# Patient Record
Sex: Female | Born: 1990 | Hispanic: Yes | Marital: Married | Smoking: Never smoker
Health system: Southern US, Academic
[De-identification: ages and names within clinical notes are randomized; demographics above are authoritative.]

## PROBLEM LIST (undated history)

## (undated) DIAGNOSIS — M419 Scoliosis, unspecified: Secondary | ICD-10-CM

## (undated) DIAGNOSIS — F419 Anxiety disorder, unspecified: Secondary | ICD-10-CM

## (undated) HISTORY — DX: Scoliosis, unspecified: M41.9

## (undated) HISTORY — DX: Anxiety disorder, unspecified: F41.9

---

## 2020-03-13 ENCOUNTER — Ambulatory Visit (INDEPENDENT_AMBULATORY_CARE_PROVIDER_SITE_OTHER): Payer: Self-pay

## 2020-03-13 NOTE — Telephone Encounter (Signed)
I called and spoke with Margaret Mccormick she is aware that she needs to hand carry her disk with her to her appt, on Monday. [email or web address] 03-13-20

## 2020-03-17 ENCOUNTER — Encounter (INDEPENDENT_AMBULATORY_CARE_PROVIDER_SITE_OTHER): Payer: Self-pay | Admitting: Neurological Surgery

## 2020-03-17 ENCOUNTER — Other Ambulatory Visit: Payer: Self-pay

## 2020-03-17 ENCOUNTER — Ambulatory Visit: Payer: Medicaid Other | Attending: Neurological Surgery | Admitting: Neurological Surgery

## 2020-03-17 VITALS — BP 122/74 | HR 60 | Temp 97.2°F | Ht 64.0 in | Wt 221.3 lb

## 2020-03-17 DIAGNOSIS — G935 Compression of brain: Secondary | ICD-10-CM | POA: Insufficient documentation

## 2020-03-17 DIAGNOSIS — Z6837 Body mass index (BMI) 37.0-37.9, adult: Secondary | ICD-10-CM

## 2020-03-17 NOTE — Progress Notes (Signed)
I personally saw and examined the patient. See the mid-level provider's note for additional details. My findings are as follows:    History:  2 year history of pressure sensation in back of head. More noticeable past year. Gets some dizziness and nausea as well. Recently started on propranolol.    Exam:  Awake alert motor intact.    Studies:  MRI shows chiari I with tonsils 8 mm below FM. No hydrocephalus.    Imp:  Chiari I. Has some posterior head discomfort, starting medical therapy.    Plan:  Symptoms mild and limited at this point. Agree with initial medical treatment. Return if symptoms worsen, additional neurological findings develop.      Barnabas Lister, MD 03/17/2020, 10:34

## 2020-03-17 NOTE — Progress Notes (Signed)
[facility]  New Outpatient/Consult    Margaret Mccormick  Date of Service: 03/20/2020  Referring physician: Dorinda Hill, MD  [address]  [address] 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 right handed female who presents for evaluation of Chiari Malformation.     She began having headaches about 2 years ago. Headaches are occipital and are associated with dizziness, nausea, and difficulty focusing. She has started taking Inderal for headaches and believes they have improves slightly. Headaches still occur daily and are aggravated by having a bowel movement. Headaches occur at rest as well, slightly relieved with laying down. Dizziness only occurs with movement. She endorses frequent neck and shoulder tension- denies pain today. Has occasional arm pain with intermittent tingling of BUE. She endorses generalized fatigue and feels that she "spaces out". Reports a strange sensation around her occiput that is sometimes described as sore. She denies obvious seizure like activity, leg pain, falls, gait instability, use of assistive devices, and bowel.bladder changes.     Conservative treatments: No frequent PRN medications- hasn't had much improvement. No PT or injections.     PMH significant for scoliosis and anxiety  No tobacco use  No spine surgeries    Past History    Current Outpatient Medications:     propranoloL (INDERAL) 20 mg Oral Tablet, Take 20 mg by mouth Three times a day, Disp: , Rfl:   No Known Allergies  Past Medical History:   Diagnosis Date    Anxiety     Scoliosis          Past Surgical History:   Procedure Laterality Date    HX CHOLECYSTECTOMY         Family History  Family Medical History:    None            Social History  Social History     Socioeconomic History    Marital status: Married     Spouse name: Not on file    Number of children: Not on file    Years of education: Not on file    Highest education level: Not on file   Tobacco Use    Smoking status: Never Smoker    Smokeless tobacco: Never Used   Substance and Sexual Activity    Alcohol use: Yes     Comment: occ    Drug use: Never       Review of Systems  Other than ROS in the HPI, all other systems were negative.  Examination  BP 122/74    Pulse 60    Temp 36.2 C (97.2 F) (Thermal Scan)    Ht 1.626 m (5\' 4" )    Wt 100 kg (221 lb 5.5 oz)    SpO2 96%    BMI 37.99 kg/m       Constitutional:Well groomed, in no apparent distress  Skin:  Warm, dry, good color. No wounds or incisions  Head:  Normocephalic/ Atraumatic.   Eyes: Conjunctiva clear, + red reflex  ENT:  Tongue midline, trachea midline   Cardiovascular:    Peripheral vascular system: Pulses palpable, no  peripheral edema   Respiratory:  Unlabored  Gastrointestinal: Soft, no tenderness  Hem/Lymph:  Deferred  Musculoskeletal  Gait and Station: slow and steady with no ataxia  Strength:  Arm Right Left Leg Right Left   Deltoid (shoulder abduction) 5/5 5/5 Hip Flexion 5/5 5/5   Biceps (Elbow flexion) 5/5 5/5 Knee Flexion 5/5 5/5   Triceps (Elbow extension) 5/5 5/5 Knee Extension 5/5 5/5   Grip 5/5 5/5 Foot Dorsiflexion 5/5 5/5      Foot Plantarflexion 5/5 5/5   Muscle tone (upper extremities): WNL  Muscle tone (lower extremities): WNL  Sensory: Sensory exam in the upper and lower extremities is normal  DTR's:    Brachio-  radialis Biceps Triceps Patellar Achilles Babinski Hoffman's   Right 2+ 2+ 2+ 2+ 2+ Downgoing Not present   Left 2+ 2+ 2+ 2+ 2+ Downgoing Not present   Ankle clonus: Negative  Coordination: Normal rapid alternating movements and finger to nose intact. No pronator drift.   Musculoskeletal tenderness: Positive in cervical spine   Neurological  Level of consciousness: Alert and  oriented  Recent and remote memory: Good recall and able to follow commands  Attention span and concentration: Normal in conversation  Language/Speech: No aphasia or dysarthria  Fund of knowledge: Appropriate in this setting  Cranial Nerves  2nd: PERRL  3rd,4th,6th:  EOMI, no nystagmus  5th: Facial sensation intact  7th: No facial asymmetry or droop  8th: Hearing grossly intact  9th/ 10th: Palate symmetric  11th: Normal shoulder shrug  12th: Tongue midline    Data reviewed  - Reviewed MRI Brain wo done 12/05/19 in [facility]  - Official radiology report pending  - See Dr. Nickie Retort interpretation   - Outside records/ Previous charts reviewed      Assessment:  Margaret Mccormick is a 29 y.o. right handed female who presents for evaluation of Chiari Malformation. She endorses occipital pressure and headaches that are associated with nausea and dizziness. She has started taking inderal for headaches. On exam strength and sensation are intact. MRI was reviewed with Dr. Cherrie Distance and no surgical intervention is indicated at this time.     ICD-10-CM    1. 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 Plan was explained and discussed during the visit. The pt was given the opportunity to ask questions and they were addressed. Margaret Mccormick is in agreement with the following plan:    - No neurosurgical intervention is indicated  - Continue medical management   - RTC as needed for persistent or worsening symptoms     - The patient has been advised to follow up with their PCP in regards any chronic medical conditions and any non-neurosurgical symptoms that they may have. A copy of the note from today's clinic appointment will be sent to the patient's PCP Onnie Boer, NP on file (confirmed during visit)    The patient was seen as a shared visit with the co-signing faculty.    Charlette Caffey, APRN,NP-C 03/20/2020, 14:44    With Dr. Cherrie Distance

## 2020-03-20 DIAGNOSIS — G935 Compression of brain: Secondary | ICD-10-CM | POA: Insufficient documentation

## 2022-03-30 IMAGING — MR MRI BRAIN W/O CONTRAST
9 series · 48 of 48 positions shown · non-contrast
Comparison: None available.

﻿EXAM:  MRI BRAIN W/O CONTRAST
INDICATION: Headaches, migraines.  History of Chiari malformation.
TECHNIQUE: Noncontrast multiplanar, multisequence MRI was performed.

[Series 5: DWI · axial · 5.0mm · 1.35mm/px · z∈[-30,+96]mm · 16 of 88 slices shown (1 of 3)]
[im 1/88]
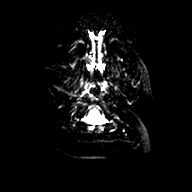
[im 6/88]
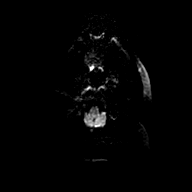
[im 12/88]
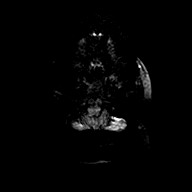
[im 18/88]
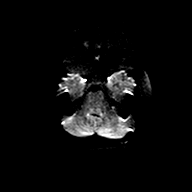
[im 24/88]
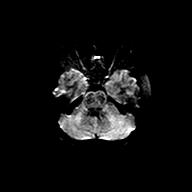
[im 30/88]
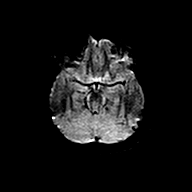
[im 35/88]
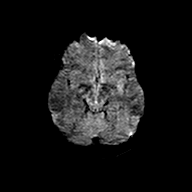
[im 41/88]
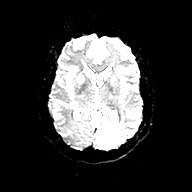
[im 47/88]
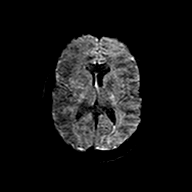
[im 53/88]
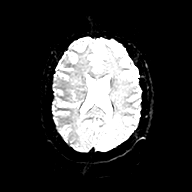
[im 59/88]
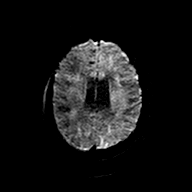
[im 64/88]
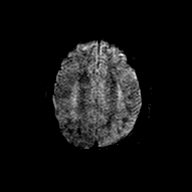
[im 70/88]
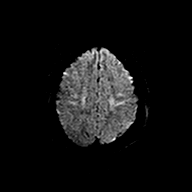
[im 76/88]
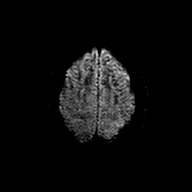
[im 82/88]
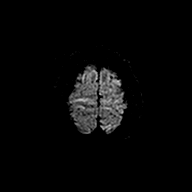
[im 88/88]
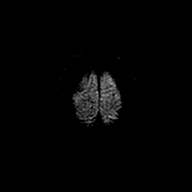

[Series 6: DWI · axial · 5.0mm · 1.35mm/px · z∈[-30,+96]mm · 4 of 22 slices shown (2 of 3)]
[im 1/22]
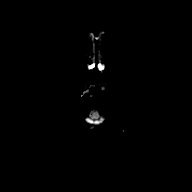
[im 8/22]
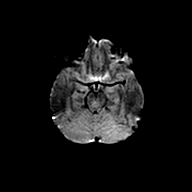
[im 15/22]
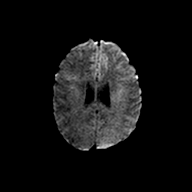
[im 22/22]
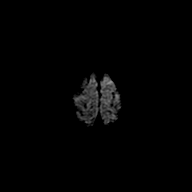

[Series 7: DWI · axial · 5.0mm · 1.35mm/px · z∈[-30,+96]mm · 4 of 22 slices shown (3 of 3)]
[im 1/22]
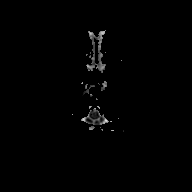
[im 8/22]
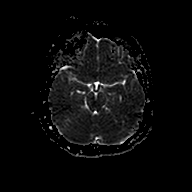
[im 15/22]
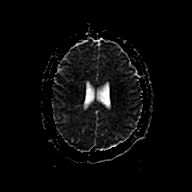
[im 22/22]
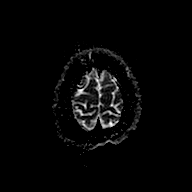

[Series 8: FLAIR · sagittal · 4.0mm · 0.75mm/px · 4 of 26 slices shown (1 of 2)]
[im 1/26]
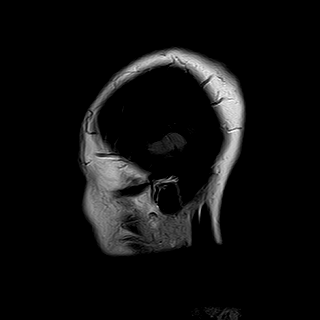
[im 9/26]
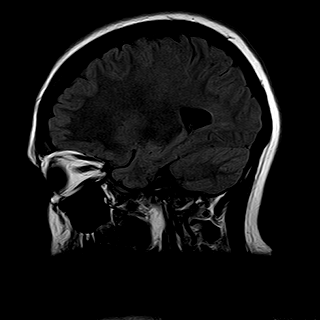
[im 17/26]
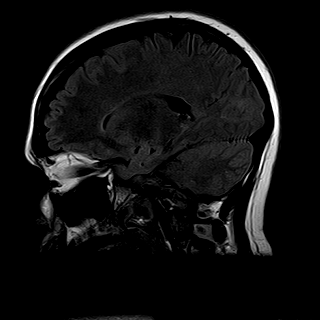
[im 26/26]
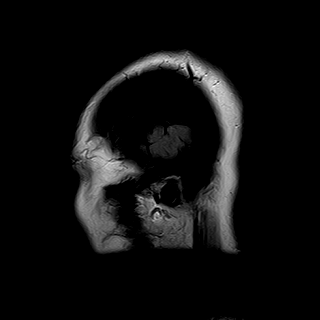

[Series 9: T2 · axial · 5.0mm · 0.43mm/px · z∈[-43,+101]mm · 4 of 25 slices shown (1 of 2)]
[im 1/25]
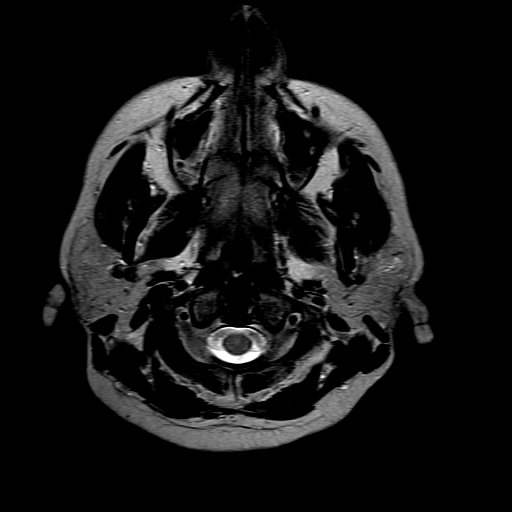
[im 9/25]
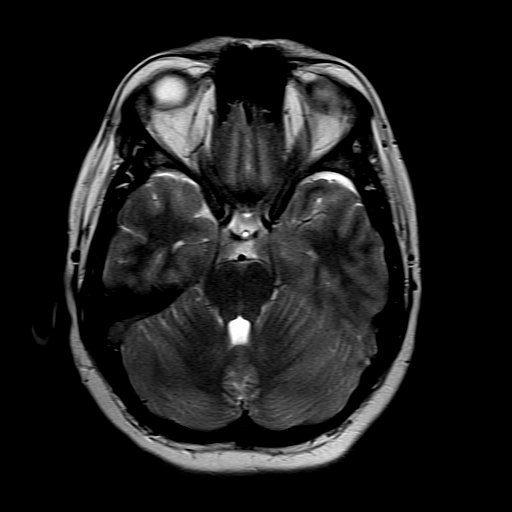
[im 17/25]
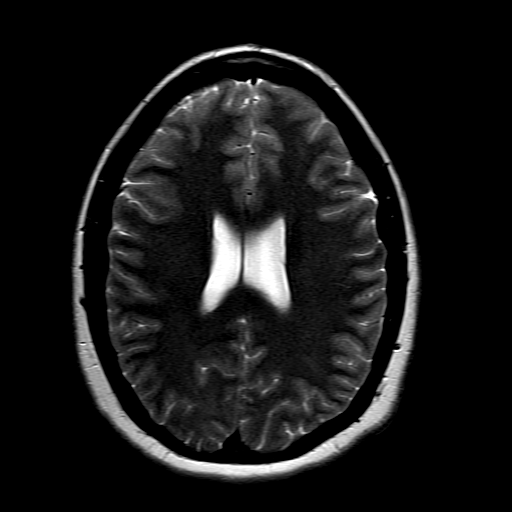
[im 25/25]
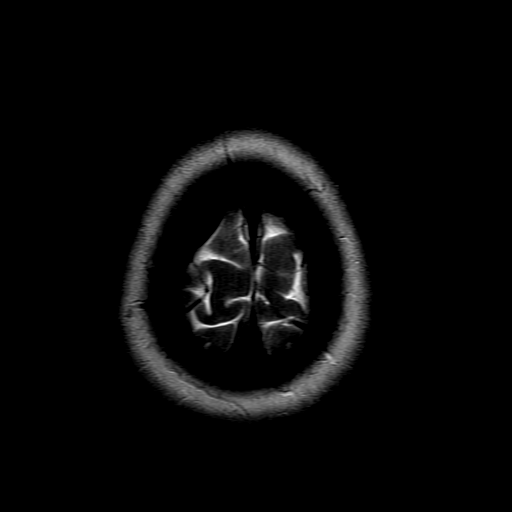

[Series 10: FLAIR · axial · 5.0mm · 0.76mm/px · z∈[-34,+92]mm · 4 of 22 slices shown (2 of 2)]
[im 1/22]
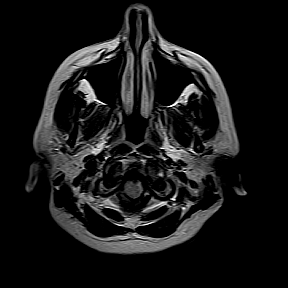
[im 8/22]
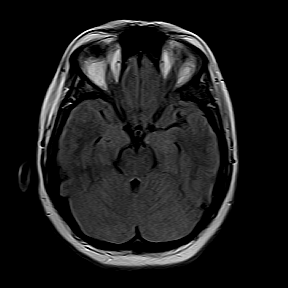
[im 15/22]
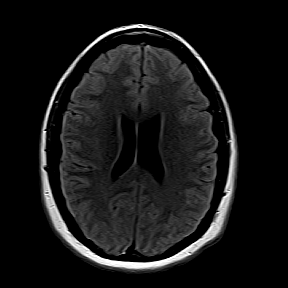
[im 22/22]
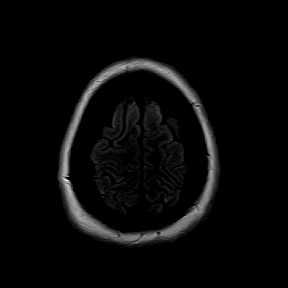

[Series 11: T1 · axial · 5.0mm · 0.69mm/px · z∈[-43,+101]mm · 4 of 25 slices shown]
[im 1/25]
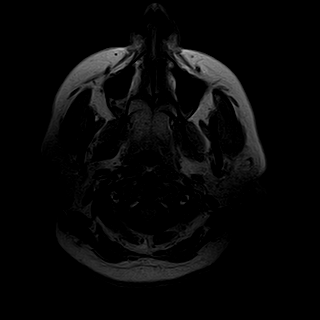
[im 9/25]
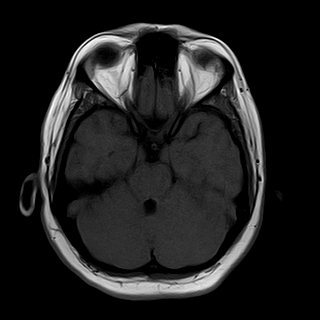
[im 17/25]
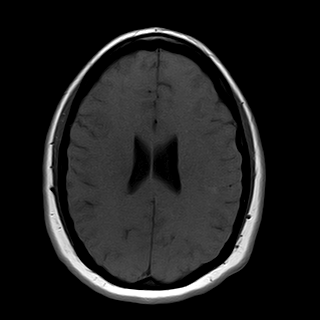
[im 25/25]
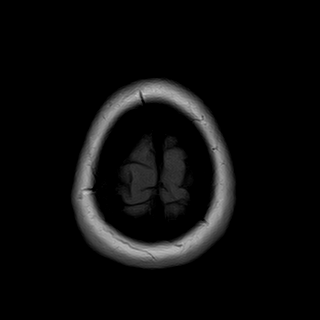

[Series 12: T2-star · axial · 5.0mm · 0.69mm/px · z∈[-43,+101]mm · 4 of 25 slices shown]
[im 1/25]
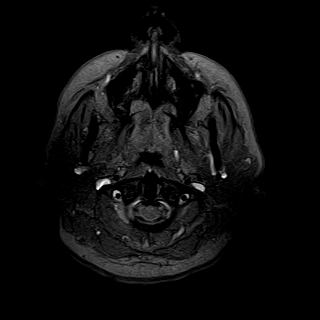
[im 9/25]
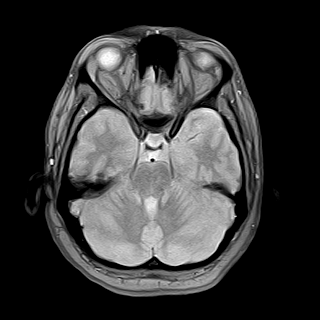
[im 17/25]
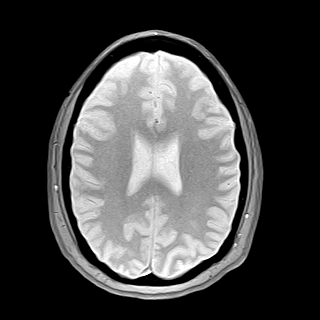
[im 25/25]
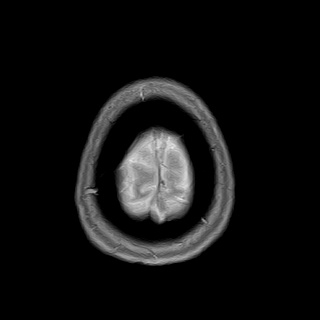

[Series 13: T2 · coronal · 6.0mm · 0.43mm/px · 4 of 24 slices shown (2 of 2)]
[im 1/24]
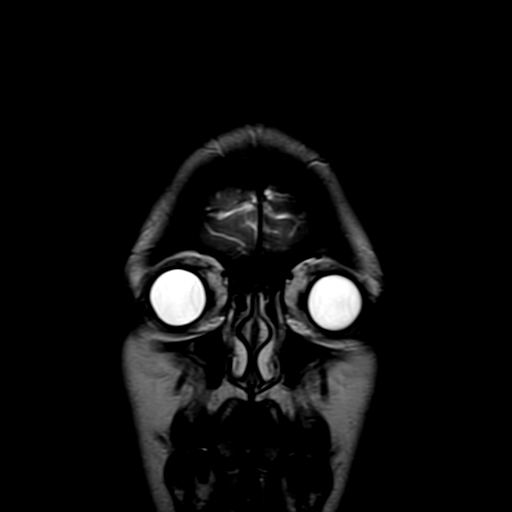
[im 8/24]
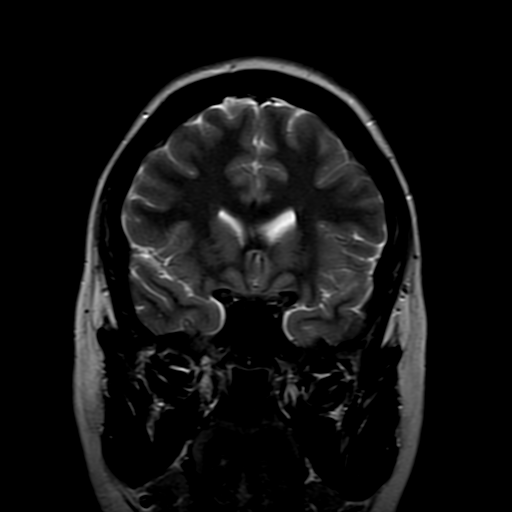
[im 16/24]
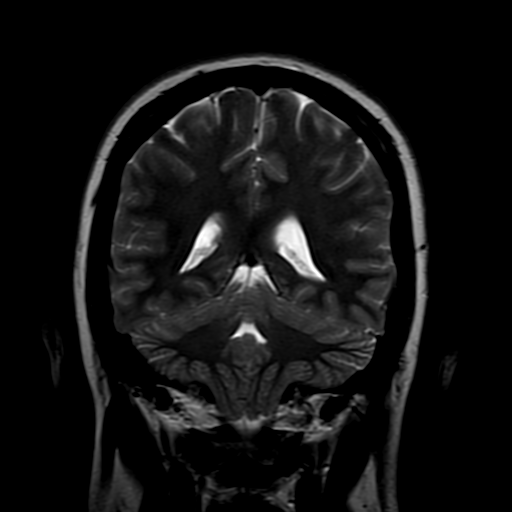
[im 24/24]
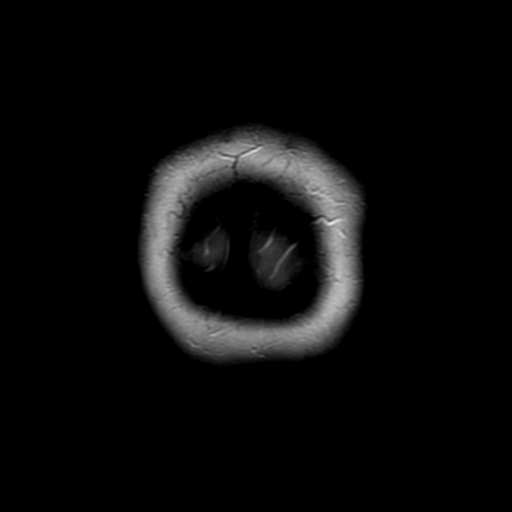

[48 of 48 positions shown; findings below may reference images not displayed]

FINDINGS: There is a Chiari I malformation.  The cerebellar tonsils extend approximately 9 mm below the level of the foramen magnum.  

There is no evidence of mass, hemorrhage, shift of the midline structures, or extra-axial collection.  There is no significant white matter disease.  No aneurysm is seen.  

The paranasal sinuses and mastoid air cells are essentially clear.
IMPRESSION: 1. Chiari I malformation.

2. Otherwise, unremarkable examination.

## 2023-04-05 IMAGING — MR MRI BRAIN W/O CONTRAST
9 series · 48 of 48 positions shown · non-contrast
Comparison: MRI brain dated 03/30/2022.

﻿EXAM:  MRI BRAIN W/O CONTRAST
INDICATION: 32-year-old with persistent headache.  History of Chiari 1 malformation on prior MRI brain. Family history of cerebral aneurysm in grandmother.
TECHNIQUE: Multiplanar, multisequential MRI of the brain was performed without administration of contrast.

[Series 5: DWI · axial · 5.0mm · 1.35mm/px · z∈[-86,+46]mm · 16 of 92 slices shown (1 of 3)]
[im 1/92]
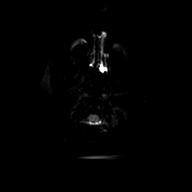
[im 7/92]
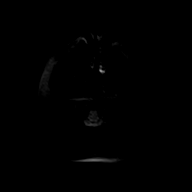
[im 13/92]
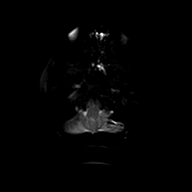
[im 19/92]
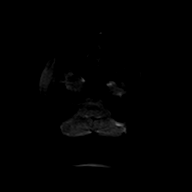
[im 25/92]
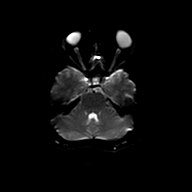
[im 31/92]
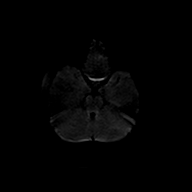
[im 37/92]
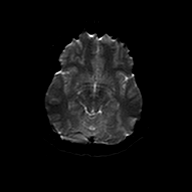
[im 43/92]
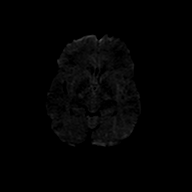
[im 49/92]
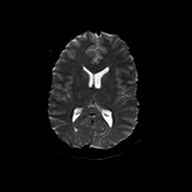
[im 55/92]
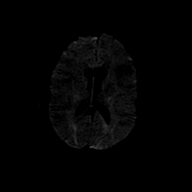
[im 61/92]
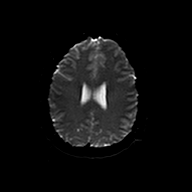
[im 67/92]
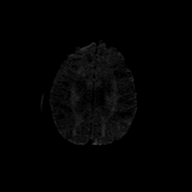
[im 73/92]
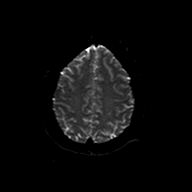
[im 79/92]
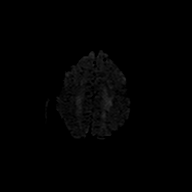
[im 85/92]
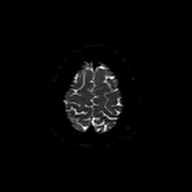
[im 92/92]
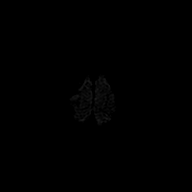

[Series 6: DWI · axial · 5.0mm · 1.35mm/px · z∈[-86,+46]mm · 4 of 23 slices shown (2 of 3)]
[im 1/23]
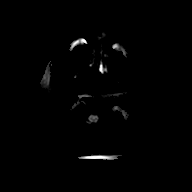
[im 8/23]
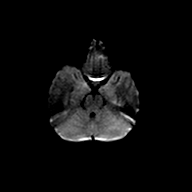
[im 15/23]
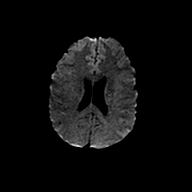
[im 23/23]
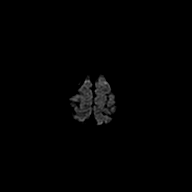

[Series 7: DWI · axial · 5.0mm · 1.35mm/px · z∈[-86,+46]mm · 4 of 23 slices shown (3 of 3)]
[im 1/23]
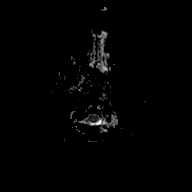
[im 8/23]
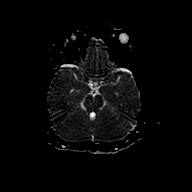
[im 15/23]
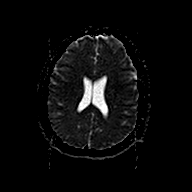
[im 23/23]
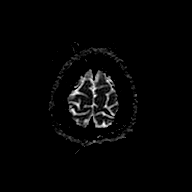

[Series 8: FLAIR · sagittal · 4.0mm · 0.75mm/px · 4 of 26 slices shown (1 of 2)]
[im 1/26]
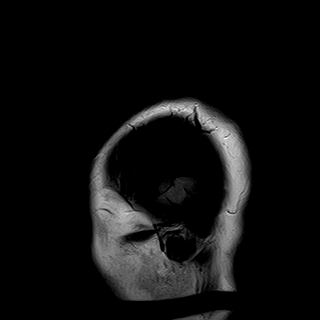
[im 9/26]
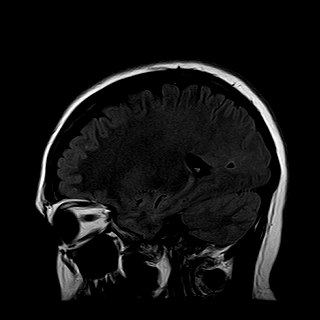
[im 17/26]
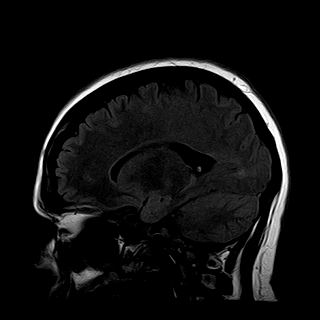
[im 26/26]
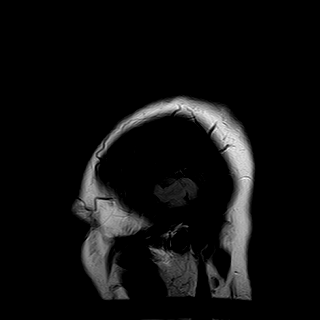

[Series 9: T2 · axial · 5.0mm · 0.43mm/px · z∈[-93,+51]mm · 4 of 25 slices shown (1 of 2)]
[im 1/25]
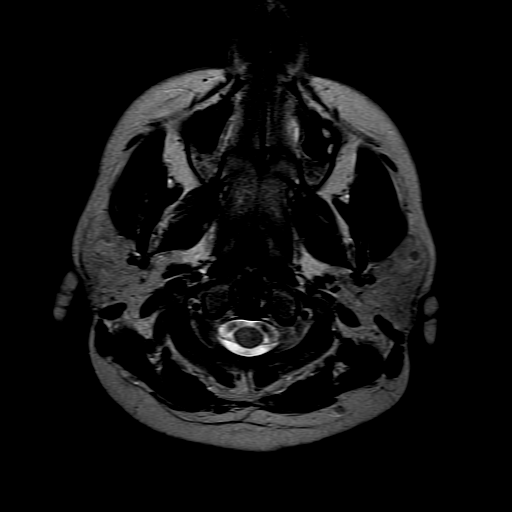
[im 9/25]
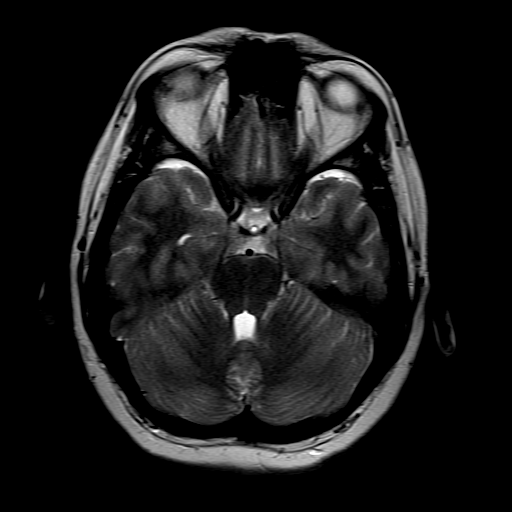
[im 17/25]
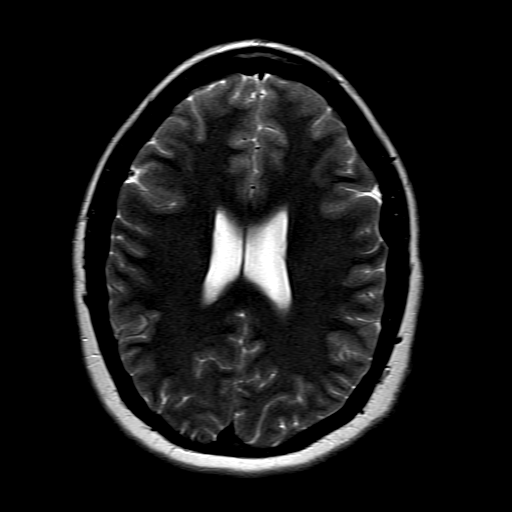
[im 25/25]
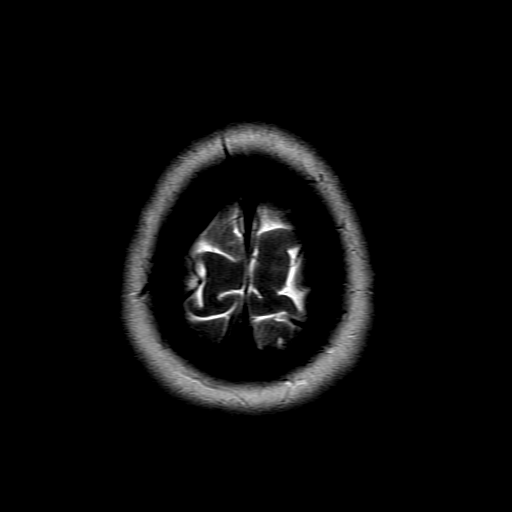

[Series 10: FLAIR · axial · 5.0mm · 0.76mm/px · z∈[-90,+42]mm · 4 of 23 slices shown (2 of 2)]
[im 1/23]
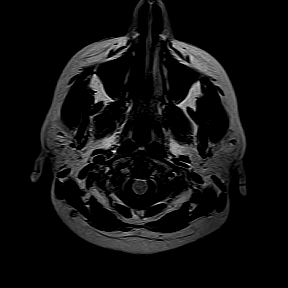
[im 8/23]
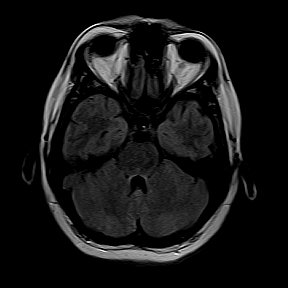
[im 15/23]
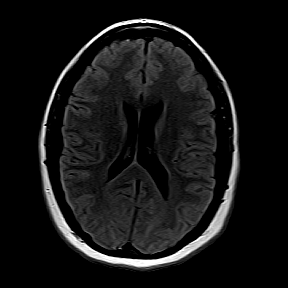
[im 23/23]
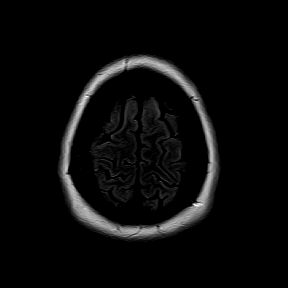

[Series 11: T1 · axial · 5.0mm · 0.69mm/px · z∈[-90,+42]mm · 4 of 23 slices shown]
[im 1/23]
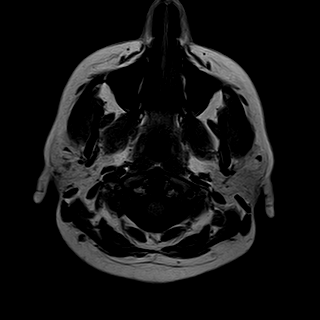
[im 8/23]
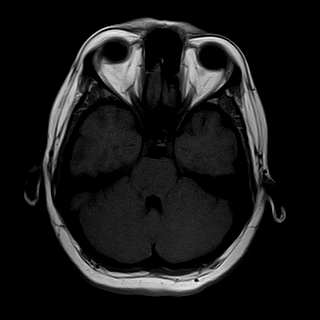
[im 15/23]
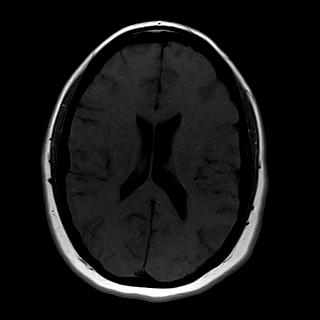
[im 23/23]
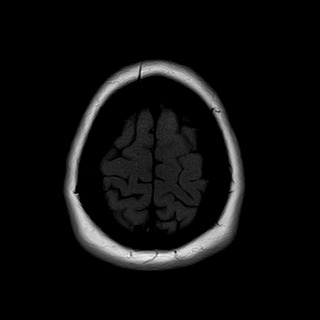

[Series 12: T2-star · axial · 5.0mm · 0.69mm/px · z∈[-90,+42]mm · 4 of 23 slices shown]
[im 1/23]
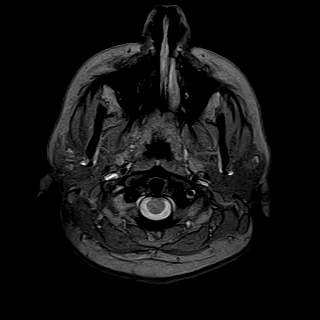
[im 8/23]
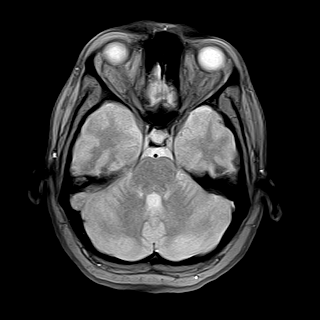
[im 15/23]
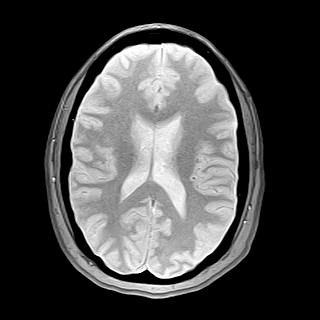
[im 23/23]
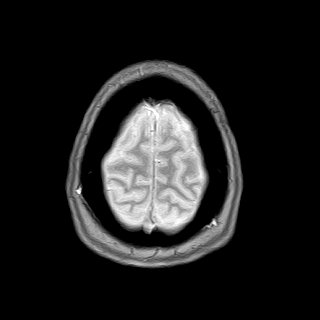

[Series 13: T2 · coronal · 6.0mm · 0.43mm/px · 4 of 24 slices shown (2 of 2)]
[im 1/24]
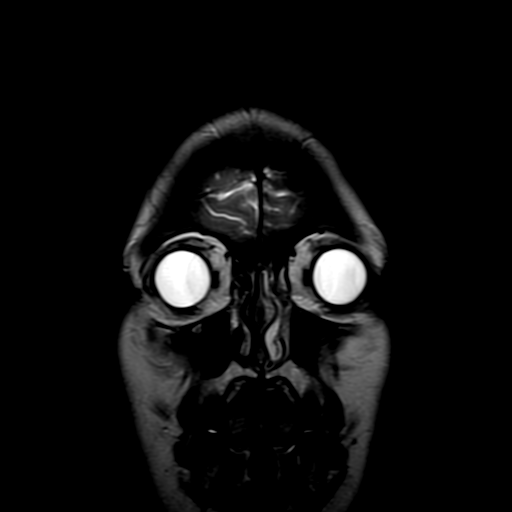
[im 8/24]
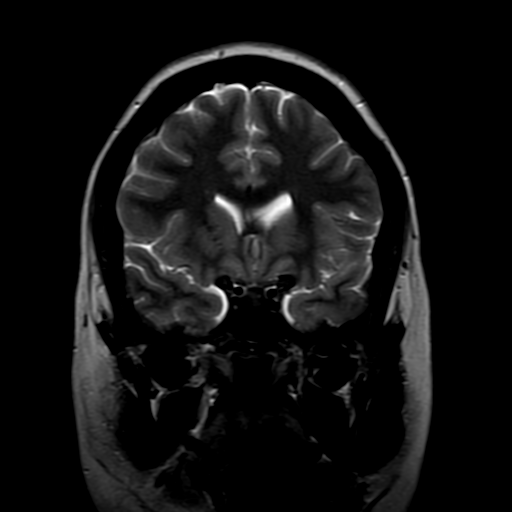
[im 16/24]
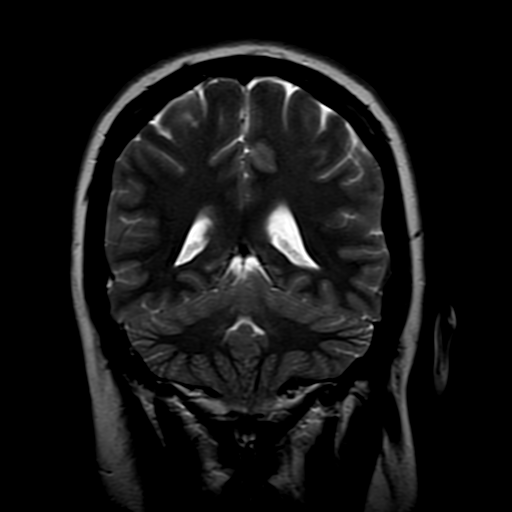
[im 24/24]
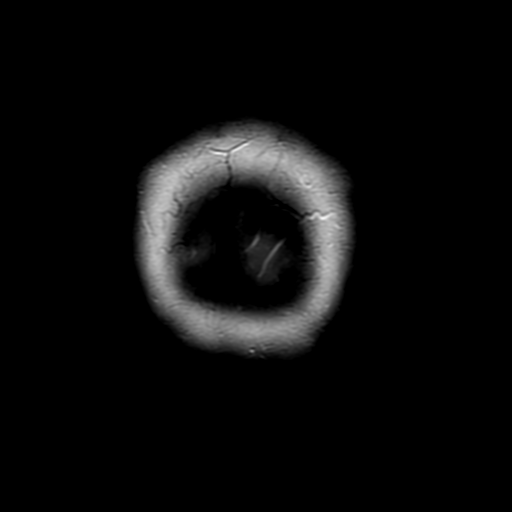

[48 of 48 positions shown; findings below may reference images not displayed]

FINDINGS: No focal areas of restricted diffusion.  No intracranial bleed or extra-axial collections are seen.  Stable mild asymmetry in size of lateral ventricles, left slightly more prominent than right is unchanged from previous examination.  No midline shift.  No significant ventriculomegaly.

Stable Chiari 1 malformation in the posterior fossa.  No space-occupying lesions are seen.  Major vascular structures of circle of Willis and dural venous sinuses are patent.  Paranasal sinuses show minimal inflammatory changes of ethmoid cells. Mastoids are clear.
IMPRESSION: 1. No acute ischemia, chronic ischemia or demyelinating lesions of the brain.

2. Stable mild asymmetry in the size of lateral ventricles.  Stable Chiari 1 malformation of the posterior fossa.

3. Mild inflammatory changes of ethmoid sinuses.
